# Patient Record
Sex: Male | Born: 1995 | Race: Black or African American | Hispanic: No | Marital: Single | Smoking: Never smoker
Health system: Southern US, Community
[De-identification: ages and names within clinical notes are randomized; demographics above are authoritative.]

## PROBLEM LIST (undated history)

## (undated) DIAGNOSIS — J45909 Unspecified asthma, uncomplicated: Secondary | ICD-10-CM

---

## 2019-02-11 ENCOUNTER — Other Ambulatory Visit: Payer: Self-pay

## 2019-02-11 ENCOUNTER — Emergency Department
Admission: EM | Admit: 2019-02-11 | Discharge: 2019-02-11 | Disposition: A | Discharge status: Home / Self Care | Payer: Medicaid - Out of State | Attending: Emergency Medicine | Admitting: Emergency Medicine

## 2019-02-11 ENCOUNTER — Encounter: Payer: Self-pay | Admitting: Emergency Medicine

## 2019-02-11 DIAGNOSIS — J45909 Unspecified asthma, uncomplicated: Secondary | ICD-10-CM | POA: Insufficient documentation

## 2019-02-11 DIAGNOSIS — Z202 Contact with and (suspected) exposure to infections with a predominantly sexual mode of transmission: Secondary | ICD-10-CM | POA: Insufficient documentation

## 2019-02-11 HISTORY — DX: Unspecified asthma, uncomplicated: J45.909

## 2019-02-11 MED ORDER — AZITHROMYCIN 500 MG PO TABS
1000.0000 mg | ORAL_TABLET | Freq: Once | ORAL | Status: AC
  Administered 2019-02-11: 14:00:00 1000 mg via ORAL
  Filled 2019-02-11: qty 2

## 2019-02-11 MED ORDER — CEFTRIAXONE SODIUM 250 MG IJ SOLR
250.0000 mg | Freq: Once | INTRAMUSCULAR | Status: AC
  Administered 2019-02-11: 14:00:00 250 mg via INTRAMUSCULAR
  Filled 2019-02-11: qty 250

## 2019-02-11 NOTE — ED Triage Notes (Signed)
Pt states his SO tested positive for chlamydia, denies symptoms, states wants a prescription for tx. States recent unprotected sex with SO.

## 2019-02-11 NOTE — Discharge Instructions (Addendum)
You have been treated for gonorrhea and chlamydia. You should avoid any intimate/sexual contact for 1 week. Your partner(s) should be tested, treated, and symptom-free. Follow-up with the [facility] Department for ongoing symptoms.

## 2019-02-11 NOTE — ED Provider Notes (Signed)
[facility] Emergency Department Provider Note ____________________________________________  Time seen: 1319  I have reviewed the triage vital signs and the nursing notes.  HISTORY  Chief Complaint  SEXUALLY TRANSMITTED DISEASE  HPI Carl Munoz is a 23 y.o. male presents himself to the ED for evaluation of STD exposure.  Patient describes he was notified by his partner, that they tested positive for chlamydia.  He presents now for evaluation and treatment.  He denies any complaints or symptoms at this time.  Past Medical History:  Diagnosis Date  . Asthma     There are no active problems to display for this patient.   History reviewed. No pertinent surgical history.  Prior to Admission medications   Not on File    Allergies Patient has no known allergies.  No family history on file.  Social History Social History   Tobacco Use  . Smoking status: Never Smoker  . Smokeless tobacco: Never Used  Substance Use Topics  . Alcohol use: Not Currently  . Drug use: Not Currently    Review of Systems  Constitutional: Negative for fever. Eyes: Negative for visual changes. ENT: Negative for sore throat. Cardiovascular: Negative for chest pain. Respiratory: Negative for shortness of breath. Gastrointestinal: Negative for abdominal pain, vomiting and diarrhea. Genitourinary: Negative for dysuria. Musculoskeletal: Negative for back pain. Skin: Negative for rash. Neurological: Negative for headaches, focal weakness or numbness. ____________________________________________  PHYSICAL EXAM:  VITAL SIGNS: ED Triage Vitals [02/11/19 1304]  Enc Vitals Group     BP 140/78     Pulse Rate (!) 54     Resp 18     Temp 98.9 F (37.2 C)     Temp Source Oral     SpO2 99 %     Weight 180 lb (81.6 kg)     Height 6' (1.829 m)     Head Circumference      Peak Flow      Pain Score 0     Pain Loc      Pain Edu?      Excl. in [place]?     Constitutional:  Alert and oriented. Well appearing and in no distress. Head: Normocephalic and atraumatic. Eyes: Conjunctivae are normal. Normal extraocular movements Cardiovascular: Normal rate, regular rhythm. Normal distal pulses. Respiratory: Normal respiratory effort GU: Deferred Musculoskeletal: Nontender with normal range of motion in all extremities.  Neurologic:  Normal gait without ataxia. Normal speech and language. No gross focal neurologic deficits are appreciated. Skin:  Skin is warm, dry and intact. No rash noted. ____________________________________________   LABS (pertinent positives/negatives) Labs Reviewed  GC/CHLAMYDIA PROBE AMP  ____________________________________________  PROCEDURES  Procedures Rocephin 250 mg IM Azithromycin 1 g PO ____________________________________________  INITIAL IMPRESSION / ASSESSMENT AND PLAN / ED COURSE  Carl Munoz was evaluated in Emergency Department on 02/11/2019 for the symptoms described in the history of present illness. He was evaluated in the context of the global COVID-19 pandemic, which necessitated consideration that the patient might be at risk for infection with the SARS-CoV-2 virus that causes COVID-19. Institutional protocols and algorithms that pertain to the evaluation of patients at risk for COVID-19 are in a state of rapid change based on information released by regulatory bodies including the CDC and federal and state organizations. These policies and algorithms were followed during the patient's care in the ED.  Patient with ED evaluation over STD exposure.  He presents for evaluation and management.  He denies any GU symptoms at this time.  Patient is treated empirically for gonorrhea/chlamydia with azithromycin and Rocephin.  He is advised to avoid any sexual contact for least a week.  He is also notified that his partner should be tested, treated, and fully symptom-free before any intimate contact.  He will follow-up with the  [facility] health department as needed.  Return precautions have been reviewed. ____________________________________________  FINAL CLINICAL IMPRESSION(S) / ED DIAGNOSES  Final diagnoses:  STD exposure      Daesia Zylka, Charlesetta IvoryJenise V Bacon, PA-C 02/11/19 1357    Minna AntisPaduchowski, Kevin, MD 02/11/19 1422

## 2019-02-11 NOTE — ED Notes (Signed)
See triage note  States he is here for STD treatment    States his sexual partner was positive for chlamydia

## 2019-02-14 ENCOUNTER — Telehealth: Payer: Self-pay | Admitting: Emergency Medicine

## 2019-02-14 LAB — GC/CHLAMYDIA PROBE AMP
Chlamydia trachomatis, NAA: POSITIVE — AB
Neisseria Gonorrhoeae by PCR: NEGATIVE

## 2019-02-14 NOTE — Telephone Encounter (Signed)
Called patient to inform of positive chlamydia.  He was treated during ED visit.  Left message asking him to call me

## 2019-02-14 NOTE — Telephone Encounter (Signed)
Patient called me back and I gave him result of chlamydia test.

## 2019-05-17 ENCOUNTER — Other Ambulatory Visit: Payer: Self-pay

## 2019-05-17 ENCOUNTER — Emergency Department
Admission: EM | Admit: 2019-05-17 | Discharge: 2019-05-17 | Disposition: A | Discharge status: Home / Self Care | Payer: Medicaid - Out of State | Attending: Emergency Medicine | Admitting: Emergency Medicine

## 2019-05-17 ENCOUNTER — Encounter: Payer: Self-pay | Admitting: Emergency Medicine

## 2019-05-17 DIAGNOSIS — M545 Low back pain, unspecified: Secondary | ICD-10-CM

## 2019-05-17 DIAGNOSIS — X500XXA Overexertion from strenuous movement or load, initial encounter: Secondary | ICD-10-CM | POA: Diagnosis not present

## 2019-05-17 DIAGNOSIS — Y929 Unspecified place or not applicable: Secondary | ICD-10-CM | POA: Insufficient documentation

## 2019-05-17 DIAGNOSIS — Y93B3 Activity, free weights: Secondary | ICD-10-CM | POA: Diagnosis not present

## 2019-05-17 DIAGNOSIS — Y999 Unspecified external cause status: Secondary | ICD-10-CM | POA: Insufficient documentation

## 2019-05-17 DIAGNOSIS — J45909 Unspecified asthma, uncomplicated: Secondary | ICD-10-CM | POA: Insufficient documentation

## 2019-05-17 MED ORDER — CYCLOBENZAPRINE HCL 5 MG PO TABS: ORAL_TABLET | ORAL | 0 refills | Status: DC

## 2019-05-17 MED ORDER — IBUPROFEN 600 MG PO TABS: 600.0000 mg | ORAL_TABLET | Freq: Four times a day (QID) | ORAL | 0 refills | Status: DC | PRN

## 2019-05-17 MED ORDER — LIDOCAINE 5 % EX PTCH: 1.0000 | MEDICATED_PATCH | CUTANEOUS | 0 refills | Status: DC

## 2019-05-17 NOTE — ED Notes (Signed)
Pt c/o back pain x1 week now and thinks it may be d/t lifting weights.

## 2019-05-17 NOTE — Discharge Instructions (Signed)
Please return to the emergency department for any worsening of symptoms.  Please call either the back specialist or your primary care provider on Monday for an appointment next week.  Do not drive or work while taking the muscle relaxer.

## 2019-05-17 NOTE — ED Triage Notes (Signed)
Pt here with c/o lower back pain d/t weight lifting, walked to triage with no issues. NAD.

## 2019-05-17 NOTE — ED Provider Notes (Signed)
[facility] Emergency Department Provider Note  ____________________________________________  Time seen: Approximately 6:22 PM  I have reviewed the triage vital signs and the nursing notes.   HISTORY  Chief Complaint Back Pain    HPI Carl Munoz is a 23 y.o. male that presents to the emergency department for evaluation of low back pain after weightlifting today.  Patient was doing squats with 120 pound weights when he developed pain to his upper low back. He did not feel any specific pop. Pain is worse with movement. He had his trainer evaluated him and was told it may be a muscle.  Patient came to the emergency department to be sure.  He does not feel that anything is broken.  Pain does not radiate.  No bowel or bladder dysfunction or saddle anesthesias.  He has not taken anything for pain.  No numbness, tingling, weakness.  Past Medical History:  Diagnosis Date  . Asthma     There are no active problems to display for this patient.   History reviewed. No pertinent surgical history.  Prior to Admission medications   Medication Sig Start Date End Date Taking? Authorizing Provider  cyclobenzaprine (FLEXERIL) 5 MG tablet Take 1-2 tablets 3 times daily as needed 05/17/19   Laban Emperor, PA-C  ibuprofen (ADVIL) 600 MG tablet Take 1 tablet (600 mg total) by mouth every 6 (six) hours as needed. 05/17/19   Laban Emperor, PA-C  lidocaine (LIDODERM) 5 % Place 1 patch onto the skin daily. Remove & Discard patch within 12 hours or as directed by MD 05/17/19   Laban Emperor, PA-C    Allergies Patient has no known allergies.  No family history on file.  Social History Social History   Tobacco Use  . Smoking status: Never Smoker  . Smokeless tobacco: Never Used  Substance Use Topics  . Alcohol use: Not Currently  . Drug use: Not Currently     Review of Systems  Cardiovascular: No chest pain. Respiratory: No SOB. Gastrointestinal: No abdominal pain.   No nausea, no vomiting.  Musculoskeletal: Positive for back pain. Skin: Negative for rash, abrasions, lacerations, ecchymosis. Neurological: Negative for headaches, numbness or tingling   ____________________________________________   PHYSICAL EXAM:  VITAL SIGNS: ED Triage Vitals  Enc Vitals Group     BP 05/17/19 1654 104/83     Pulse Rate 05/17/19 1654 (!) 59     Resp 05/17/19 1654 18     Temp 05/17/19 1654 98.3 F (36.8 C)     Temp Source 05/17/19 1654 Oral     SpO2 05/17/19 1654 98 %     Weight 05/17/19 1649 185 lb (83.9 kg)     Height 05/17/19 1649 6' (1.829 m)     Head Circumference --      Peak Flow --      Pain Score 05/17/19 1649 7     Pain Loc --      Pain Edu? --      Excl. in [place]? --      Constitutional: Alert and oriented. Well appearing and in no acute distress. Eyes: Conjunctivae are normal. PERRL. EOMI. Head: Atraumatic. ENT:      Ears:      Nose: No congestion/rhinnorhea.      Mouth/Throat: Mucous membranes are moist.  Neck: No stridor. Cardiovascular: Normal rate, regular rhythm.  Good peripheral circulation. Respiratory: Normal respiratory effort without tachypnea or retractions. Lungs CTAB. Good air entry to the bases with no decreased or absent breath sounds.  Gastrointestinal: Bowel sounds 4 quadrants. Soft and nontender to palpation. No guarding or rigidity. No palpable masses. No distention.  Musculoskeletal: Full range of motion to all extremities. No gross deformities appreciated.  Mild tenderness to palpation superior lumbar spine.  No pinpoint tenderness.  Strength equal in lower extremities bilaterally.  Normal gait.   Neurologic:  Normal speech and language. No gross focal neurologic deficits are appreciated.  Skin:  Skin is warm, dry and intact. No rash noted. Psychiatric: Mood and affect are normal. Speech and behavior are normal. Patient exhibits appropriate insight and judgement.   ____________________________________________    LABS (all labs ordered are listed, but only abnormal results are displayed)  Labs Reviewed - No data to display ____________________________________________  EKG   ____________________________________________  RADIOLOGY   No results found.  ____________________________________________    PROCEDURES  Procedure(s) performed:    Procedures    Medications - No data to display   ____________________________________________   INITIAL IMPRESSION / ASSESSMENT AND PLAN / ED COURSE  Pertinent labs & imaging results that were available during my care of the patient were reviewed by me and considered in my medical decision making (see chart for details).  Review of the [place] CSRS was performed in accordance of the [facility] prior to dispensing any controlled drugs.   Patient presented to the emergency department for evaluation of back pain today.  Vital signs and exam are reassuring.  X-ray was offered and patient declines at this time.  He would prefer trying muscle relaxers and anti-inflammatories and will follow up with primary care or orthopedics next week.  Patient will be discharged home with prescriptions for Flexeril and Motrin. Patient is to follow up with primary care and orthopedics as directed. Patient is given ED precautions to return to the ED for any worsening or new symptoms.  Carl Munoz was evaluated in Emergency Department on 05/17/2019 for the symptoms described in the history of present illness. He was evaluated in the context of the global COVID-19 pandemic, which necessitated consideration that the patient might be at risk for infection with the SARS-CoV-2 virus that causes COVID-19. Institutional protocols and algorithms that pertain to the evaluation of patients at risk for COVID-19 are in a state of rapid change based on information released by regulatory bodies including the CDC and federal and state organizations. These policies and algorithms were followed  during the patient's care in the ED.   ____________________________________________  FINAL CLINICAL IMPRESSION(S) / ED DIAGNOSES  Final diagnoses:  Acute midline low back pain without sciatica      NEW MEDICATIONS STARTED DURING THIS VISIT:  ED Discharge Orders         Ordered    ibuprofen (ADVIL) 600 MG tablet  Every 6 hours PRN     05/17/19 1834    cyclobenzaprine (FLEXERIL) 5 MG tablet     05/17/19 1834    lidocaine (LIDODERM) 5 %  Every 24 hours     05/17/19 1835              This chart was dictated using voice recognition software/Dragon. Despite best efforts to proofread, errors can occur which can change the meaning. Any change was purely unintentional.    Enid Derry, PA-C 05/17/19 1913    Minna Antis, MD 05/17/19 2326

## 2020-07-31 ENCOUNTER — Ambulatory Visit
Admission: RE | Admit: 2020-07-31 | Discharge: 2020-07-31 | Disposition: A | Discharge status: Home / Self Care | Payer: No Typology Code available for payment source | Source: Ambulatory Visit | Attending: Family Medicine | Admitting: Family Medicine

## 2020-07-31 ENCOUNTER — Other Ambulatory Visit: Payer: Self-pay | Admitting: Family Medicine

## 2020-07-31 ENCOUNTER — Other Ambulatory Visit: Payer: Self-pay

## 2020-07-31 DIAGNOSIS — R0602 Shortness of breath: Secondary | ICD-10-CM

## 2020-07-31 NOTE — Progress Notes (Signed)
Patient seen and evaluated by myself at [facility]. Originally, SOB and chest tightness after running in the cold with history of asthma. Prescribed Albuterol inhaler to use before exercise but it has not helped.  Has history of COVID infection in January 06/20/2020  Will start cardiac workup and order chest x-ray.

## 2020-08-12 NOTE — Progress Notes (Unsigned)
Cardiology Office Note:   Date:  08/13/2020  NAME:  Carl Munoz    MRN: [id number] DOB:  1996/06/07   PCP:  Patient, No Pcp Per  Cardiologist:  No primary care provider on file.  Electrophysiologist:  None   Referring MD: Arlyce Harman, DO   Chief Complaint  Patient presents with  . New Patient (Initial Visit)  . Chest Pain    History of Present Illness:   Carl Munoz is a 25 y.o. male with a hx of asthma who is being seen today for the evaluation of chest pain at the request of Arlyce Harman, DO. CXR normal. He reports he had COVID-19 infection on June 20, 2020. Symptoms lasted roughly 18 days. Symptoms included headache, fatigue, shortness of breath had cold symptoms. He reports he had chest congestion as well. Since that time he tried to go back to high-level activity. He reports shortness of breath with activity as well as tightness in his chest. Symptoms of chest tightness occur only with exertion. He tried an albuterol inhaler without improvement in symptoms. 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 Only real medical problem is asthma. He has been on light protocol and then taken out as symptoms did not improve. No improvement with inhaler. X-ray was normal. He is never had any heart disease. His EKG demonstrates sinus bradycardia heart rate 47. He has profound early repolarization changes which are normal and athlete. No family history of sudden death. He is not passed out. He reports symptoms only occur with heavy exertion. Not occurring with light exercise. He does not smoke, drink or use drugs. He is setting psychology. He has 2 more years of [employer] eligibility.  Past Medical History: Past Medical History:  Diagnosis Date  . Asthma     Past Surgical History: History reviewed. No pertinent surgical history.  Current Medications: Current Meds  Medication Sig  . cyclobenzaprine (FLEXERIL) 5 MG tablet Take 1-2 tablets 3 times daily as needed  . ibuprofen  (ADVIL) 600 MG tablet Take 1 tablet (600 mg total) by mouth every 6 (six) hours as needed.  . lidocaine (LIDODERM) 5 % Place 1 patch onto the skin daily. Remove & Discard patch within 12 hours or as directed by MD     Allergies:    Patient has no known allergies.   Social History: Social History   Socioeconomic History  . Marital status: Single    Spouse name: Not on file  . Number of children: Not on file  . Years of education: Not on file  . Highest education level: Not on file  Occupational History  . Occupation: [employer]   Tobacco Use  . Smoking status: Never Smoker  . Smokeless tobacco: Never Used  Substance and Sexual Activity  . Alcohol use: Not Currently  . Drug use: Not Currently  . Sexual activity: Yes  Other Topics Concern  . Not on file  Social History Narrative  . Not on file   Social Determinants of Health   Financial Resource Strain: Not on file  Food Insecurity: Not on file  Transportation Needs: Not on file  Physical Activity: Not on file  Stress: Not on file  Social Connections: Not on file     Family History: The patient's family history is not on file.  ROS:   All other ROS reviewed and negative. Pertinent positives noted in the HPI.     EKGs/Labs/Other Studies Reviewed:   The following studies were personally reviewed  by me today:  EKG:  EKG is ordered today.  The ekg ordered today demonstrates normal sinus rhythm heart rate 47, early repolarization abnormality, normal EKG for young athletic male, and was personally reviewed by me.   Recent Labs: No results found for requested labs within last 8760 hours.   Recent Lipid Panel No results found for: CHOL, TRIG, HDL, CHOLHDL, VLDL, LDLCALC, LDLDIRECT  Physical Exam:   VS:  BP (!) 122/94 (BP Location: Left Arm, Patient Position: Sitting, Cuff Size: Normal)   Pulse (!) 47   Ht 6' (1.829 m)   Wt 188 lb (85.3 kg)   BMI 25.50 kg/m    Wt Readings from Last 3 Encounters:  08/13/20  188 lb (85.3 kg)  05/17/19 185 lb (83.9 kg)  02/11/19 180 lb (81.6 kg)    General: Well nourished, well developed, in no acute distress Head: Atraumatic, normal size  Eyes: PEERLA, EOMI  Neck: Supple, no JVD Endocrine: No thryomegaly Cardiac: Normal S1, S2; RRR; no murmurs, rubs, or gallops Lungs: Clear to auscultation bilaterally, no wheezing, rhonchi or rales  Abd: Soft, nontender, no hepatomegaly  Ext: No edema, pulses 2+ Musculoskeletal: No deformities, BUE and BLE strength normal and equal Skin: Warm and dry, no rashes   Neuro: Alert and oriented to person, place, time, and situation, CNII-XII grossly intact, no focal deficits  Psych: Normal mood and affect   ASSESSMENT:   Carl Munoz is a 25 y.o. male who presents for the following: 1. Chest pain, unspecified type   2. SOB (shortness of breath) on exertion     PLAN:   1. Chest pain, unspecified type 2. SOB (shortness of breath) on exertion -Exertional chest tightness and shortness of breath with exertion. EKG shows early repolarization normality. He has no alarming symptoms concerning for Covid myocarditis. I think he likely just has asthma type symptoms given the tightness in his chest that occurs with exertion. Symptoms appear to be worse by heavy breathing. We will exclude myocarditis with a high-sensitivity troponin. He was informed that on the day of his blood tests he will need to refrain from any activity for 24 hours. He should just go to class and that is it. We also will set him up for an echocardiogram to exclude any subclinical myocardial injury. He has no evidence of heart failure examination. If his above work-up is negative, I have little to offer. He is unlikely to have any obstructive CAD. He is too young for this. He is a very high functioning athlete. We will communicate our findings to [facility] [phone]. She is the Event organiser. We also will reach out to the team physician Arlyce Harman.   Disposition: No follow-ups on file.  Medication Adjustments/Labs and Tests Ordered: Current medicines are reviewed at length with the patient today.  Concerns regarding medicines are outlined above.  Orders Placed This Encounter  Procedures  . EKG 12-Lead  . ECHOCARDIOGRAM COMPLETE   No orders of the defined types were placed in this encounter.   Patient Instructions  Medication Instructions:  The current medical regimen is effective;  continue present plan and medications.  *If you need a refill on your cardiac medications before your next appointment, please call your pharmacy*   Lab Work: HS TROPONIN ([address], with ECHO)  If you have labs (blood work) drawn today and your tests are completely normal, you will receive your results only by: Marland Kitchen MyChart Message (if you have MyChart) OR . A paper copy in  the mail If you have any lab test that is abnormal or we need to change your treatment, we will call you to review the results.   Testing/Procedures: Echocardiogram - Your physician has requested that you have an echocardiogram. Echocardiography is a painless test that uses sound waves to create images of your heart. It provides your doctor with information about the size and shape of your heart and how well your heart's chambers and valves are working. This procedure takes approximately one hour. There are no restrictions for this procedure. This will be performed at our [facility] location - [address], [address].    Follow-Up: At [facility], you and your health needs are our priority.  As part of our continuing mission to provide you with exceptional heart care, we have created designated Provider Care Teams.  These Care Teams include your primary Cardiologist (physician) and Advanced Practice Providers (APPs -  Physician Assistants and Nurse Practitioners) who all work together to provide you with the care you need, when you need it.  We recommend  signing up for the patient portal called "MyChart".  Sign up information is provided on this After Visit Summary.  MyChart is used to connect with patients for Virtual Visits (Telemedicine).  Patients are able to view lab/test results, encounter notes, upcoming appointments, etc.  Non-urgent messages can be sent to your provider as well.   To learn more about what you can do with MyChart, go to [email or web address].    Your next appointment:   As needed  The format for your next appointment:   In Person  Provider:   Lennie Odor, MD       Signed, Lenna Gilford. Flora Lipps, MD [facility]  [address], [address] [place], [place] [address] [phone]  08/13/2020 3:04 PM

## 2020-08-13 ENCOUNTER — Ambulatory Visit (INDEPENDENT_AMBULATORY_CARE_PROVIDER_SITE_OTHER): Payer: PRIVATE HEALTH INSURANCE | Admitting: Cardiovascular Disease

## 2020-08-13 ENCOUNTER — Encounter: Payer: Self-pay | Admitting: Cardiovascular Disease

## 2020-08-13 ENCOUNTER — Other Ambulatory Visit: Payer: Self-pay

## 2020-08-13 VITALS — BP 122/94 | HR 47 | Ht 72.0 in | Wt 188.0 lb

## 2020-08-13 DIAGNOSIS — R079 Chest pain, unspecified: Secondary | ICD-10-CM

## 2020-08-13 DIAGNOSIS — R0602 Shortness of breath: Secondary | ICD-10-CM | POA: Diagnosis not present

## 2020-08-13 NOTE — Patient Instructions (Signed)
Medication Instructions:  The current medical regimen is effective;  continue present plan and medications.  *If you need a refill on your cardiac medications before your next appointment, please call your pharmacy*   Lab Work: HS TROPONIN ([address], with ECHO)  If you have labs (blood work) drawn today and your tests are completely normal, you will receive your results only by: Marland Kitchen MyChart Message (if you have MyChart) OR . A paper copy in the mail If you have any lab test that is abnormal or we need to change your treatment, we will call you to review the results.   Testing/Procedures: Echocardiogram - Your physician has requested that you have an echocardiogram. Echocardiography is a painless test that uses sound waves to create images of your heart. It provides your doctor with information about the size and shape of your heart and how well your heart's chambers and valves are working. This procedure takes approximately one hour. There are no restrictions for this procedure. This will be performed at our [facility] location - [address], [address].    Follow-Up: At [facility], you and your health needs are our priority.  As part of our continuing mission to provide you with exceptional heart care, we have created designated Provider Care Teams.  These Care Teams include your primary Cardiologist (physician) and Advanced Practice Providers (APPs -  Physician Assistants and Nurse Practitioners) who all work together to provide you with the care you need, when you need it.  We recommend signing up for the patient portal called "MyChart".  Sign up information is provided on this After Visit Summary.  MyChart is used to connect with patients for Virtual Visits (Telemedicine).  Patients are able to view lab/test results, encounter notes, upcoming appointments, etc.  Non-urgent messages can be sent to your provider as well.   To learn more about what you can do with MyChart, go to  [email or web address].    Your next appointment:   As needed  The format for your next appointment:   In Person  Provider:   Lennie Odor, MD

## 2020-08-18 ENCOUNTER — Other Ambulatory Visit (HOSPITAL_COMMUNITY): Payer: No Typology Code available for payment source

## 2020-08-18 ENCOUNTER — Other Ambulatory Visit: Payer: Medicaid - Out of State

## 2020-08-18 ENCOUNTER — Other Ambulatory Visit: Payer: Self-pay

## 2020-08-18 ENCOUNTER — Encounter (HOSPITAL_COMMUNITY): Payer: Self-pay

## 2020-08-18 DIAGNOSIS — R079 Chest pain, unspecified: Secondary | ICD-10-CM

## 2020-08-18 LAB — TROPONIN I (HIGH SENSITIVITY): Troponin I (High Sensitivity): 13 ng/L (ref <18)

## 2020-08-18 NOTE — Progress Notes (Signed)
Verified appointment "no show" status with A. Martinez at 14:00.

## 2020-08-19 ENCOUNTER — Other Ambulatory Visit: Payer: No Typology Code available for payment source

## 2020-09-03 ENCOUNTER — Ambulatory Visit (HOSPITAL_COMMUNITY): Payer: Medicaid - Out of State | Attending: Cardiology

## 2020-09-03 ENCOUNTER — Other Ambulatory Visit: Payer: Self-pay

## 2020-09-03 DIAGNOSIS — R079 Chest pain, unspecified: Secondary | ICD-10-CM | POA: Diagnosis present

## 2020-09-03 LAB — ECHOCARDIOGRAM COMPLETE
Area-P 1/2: 2.77 cm2
S' Lateral: 3.4 cm

## 2020-11-13 ENCOUNTER — Other Ambulatory Visit: Payer: Self-pay

## 2020-11-13 ENCOUNTER — Emergency Department
Admission: EM | Admit: 2020-11-13 | Discharge: 2020-11-13 | Disposition: A | Discharge status: Home / Self Care | Payer: Medicaid - Out of State | Attending: Emergency Medicine | Admitting: Emergency Medicine

## 2020-11-13 DIAGNOSIS — J45909 Unspecified asthma, uncomplicated: Secondary | ICD-10-CM | POA: Insufficient documentation

## 2020-11-13 DIAGNOSIS — K0889 Other specified disorders of teeth and supporting structures: Secondary | ICD-10-CM | POA: Insufficient documentation

## 2020-11-13 MED ORDER — AMOXICILLIN 875 MG PO TABS: 875.0000 mg | ORAL_TABLET | Freq: Two times a day (BID) | ORAL | 0 refills | Status: AC

## 2020-11-13 NOTE — ED Triage Notes (Signed)
Pt c/o left lower dental pain , states his tooth cracked

## 2020-11-13 NOTE — ED Notes (Signed)
Pt c/o left lower dental pain x2 days. Pt reports he cracked his molar awhile back but has not been able to see dentist due to insurance issues. Pt is AOX4, NAD noted. No swelling noted in left jaw.

## 2020-11-13 NOTE — ED Provider Notes (Signed)
[facility] Emergency Department Provider Note   ____________________________________________   Event Date/Time   First MD Initiated Contact with Patient 11/13/20 1103     (approximate)  I have reviewed the triage vital signs and the nursing notes.   HISTORY  Chief Complaint No chief complaint on file.   HPI Carl Munoz is a 25 y.o. male presents to the ED with complaint of left lower tooth pain.  Patient states that he cracked a tooth over 6 months ago but only began having pain 2 days ago.  He denies any fever or chills.  He currently does not have a Education officer, community as he is on [place] and no one in [place] will take him.  He rates pain as 7 out of 10.       Past Medical History:  Diagnosis Date  . Asthma     There are no problems to display for this patient.   History reviewed. No pertinent surgical history.  Prior to Admission medications   Medication Sig Start Date End Date Taking? Authorizing Provider  amoxicillin (AMOXIL) 875 MG tablet Take 1 tablet (875 mg total) by mouth 2 (two) times daily. 11/13/20  Yes Tommi Rumps, PA-C    Allergies Patient has no known allergies.  No family history on file.  Social History Social History   Tobacco Use  . Smoking status: Never Smoker  . Smokeless tobacco: Never Used  Substance Use Topics  . Alcohol use: Not Currently  . Drug use: Not Currently    Review of Systems Constitutional: No fever/chills Eyes: No visual changes. ENT: Positive for dental pain. Cardiovascular: Denies chest pain. Respiratory: Denies shortness of breath. Gastrointestinal:   No nausea, no vomiting.  Musculoskeletal: Negative for muscle skeletal pain. Skin: Negative for rash. Neurological: Negative for  focal weakness or numbness. ____________________________________________   PHYSICAL EXAM:  VITAL SIGNS: ED Triage Vitals  Enc Vitals Group     BP 11/13/20 1026 (!) 145/70     Pulse Rate  11/13/20 1026 (!) 45     Resp 11/13/20 1026 16     Temp 11/13/20 1026 98 F (36.7 C)     Temp Source 11/13/20 1026 Oral     SpO2 11/13/20 1026 98 %     Weight 11/13/20 1019 190 lb (86.2 kg)     Height 11/13/20 1019 6' (1.829 m)     Head Circumference --      Peak Flow --      Pain Score 11/13/20 1019 7     Pain Loc --      Pain Edu? --      Excl. in GC? --     Constitutional: Alert and oriented. Well appearing and in no acute distress. Eyes: Conjunctivae are normal. PERRL. EOMI. Head: Atraumatic. Nose: No congestion/rhinnorhea. Mouth/Throat: Mucous membranes are moist.  Oropharynx non-erythematous.  Medial aspect of the lower left premolar there is a tooth where is enamel that is avulsed.  Area has tenderness but no obvious abscess formation is noted. Neck: No stridor.   Cardiovascular: Normal rate, regular rhythm. Grossly normal heart sounds.  Good peripheral circulation. Respiratory: Normal respiratory effort.  No retractions. Lungs CTAB. Musculoskeletal: Moves upper and lower extremities any difficulty.  Normal gait was noted. Neurologic:  Normal speech and language. No gross focal neurologic deficits are appreciated. No gait instability. Skin:  Skin is warm, dry and intact. No rash noted. Psychiatric: Mood and affect are normal. Speech and behavior are normal.  ____________________________________________   LABS (all labs ordered are listed, but only abnormal results are displayed)  Labs Reviewed - No data to display  PROCEDURES  Procedure(s) performed (including Critical Care):  Procedures   ____________________________________________   INITIAL IMPRESSION / ASSESSMENT AND PLAN / ED COURSE  As part of my medical decision making, I reviewed the following data within the electronic MEDICAL RECORD NUMBER Notes from prior ED visits and [place] Controlled Substance Database  25 year old male presents to the ED with complaint of dental pain for the last 2 days.  Patient states  that he cracked his tooth over 6 months ago but has not had any problems until just recently.  Patient currently is on Medicaid out[place]of-[place] and is having problems finding a dentist that will see him.  1 option other than going to [place] to see a dentist is for him to change his Medicaid to [place] as he lives here.  He was given list of dental clinics to call to see if they accept out-of-state Medicaid.  A prescription for amoxicillin 875 twice daily was sent to his pharmacy.  ____________________________________________   FINAL CLINICAL IMPRESSION(S) / ED DIAGNOSES  Final diagnoses:  Pain, dental     ED Discharge Orders         Ordered    amoxicillin (AMOXIL) 875 MG tablet  2 times daily        11/13/20 1113          *Please note:  Carl Munoz was evaluated in Emergency Department on 11/13/2020 for the symptoms described in the history of present illness. He was evaluated in the context of the global COVID-19 pandemic, which necessitated consideration that the patient might be at risk for infection with the SARS-CoV-2 virus that causes COVID-19. Institutional protocols and algorithms that pertain to the evaluation of patients at risk for COVID-19 are in a state of rapid change based on information released by regulatory bodies including the CDC and federal and state organizations. These policies and algorithms were followed during the patient's care in the ED.  Some ED evaluations and interventions may be delayed as a result of limited staffing during and the pandemic.*   Note:  This document was prepared using Dragon voice recognition software and may include unintentional dictation errors.    Tommi Rumps, PA-C 11/13/20 1119    Minna Antis, MD 11/13/20 1943

## 2020-11-13 NOTE — Discharge Instructions (Signed)
Call the clinics listed on your discharge papers as one of them may take your out-of-state Medicaid.  Begin taking antibiotics until completely finished.  You may take Tylenol or ibuprofen with this medication if needed for pain.  You will definitely need to see a dentist to have this repaired as it will continue to give problems.  OPTIONS FOR DENTAL FOLLOW UP CARE  [place] Department of Health and Human Services - Local Safety Net Dental Clinics [email or web address]   [facility] [phone])  Carl Munoz [phone])  [place] [phone] ext [phone])  [facility] [phone])  [facility] [phone]) This clinic caters to the indigent population and is on a lottery system. Location: [employer] [facility], [employer], [address], [place] Clinic Hours: Wednesdays from 6pm - 9pm, patients seen by a lottery system. For dates, call or go to [email or web address] Services: Cleanings, fillings and simple extractions. Payment Options: DENTAL WORK IS FREE OF CHARGE. Bring proof of income or support. Best way to get seen: Arrive at 5:15 pm - this is a lottery, NOT first come/first serve, so arriving earlier will not increase your chances of being seen.     [facility] [phone] Select option 1 for emergencies   Location: [facility], [place], [address] [place] Clinic Hours: No walk-ins accepted - call the day before to schedule an appointment. Check in times are 9:30 am and 1:30 pm. Services: Simple extractions, temporary fillings, pulpectomy/pulp debridement, uncomplicated abscess drainage. Payment Options: PAYMENT IS DUE AT THE TIME OF SERVICE.  Fee is usually $100-200, additional surgical procedures (e.g. abscess drainage) may be extra. Cash, checks, Visa/MasterCard accepted.   Can file Medicaid if patient is covered for dental - patient should call case worker to check. No discount for [facility] patients. Best way to get seen: MUST call the day before and get onto the schedule. Can usually be seen the next 1-2 days. No walk-ins accepted.     [facility] [phone]   Location: [facility], [address] [place] Clinic Hours: M, W, Th, F 8am or 1:30pm, Tues 9a or 1:30 - first come/first served. Services: Simple extractions, temporary fillings, uncomplicated abscess drainage.  You do not need to be an [facility] resident. Payment Options: PAYMENT IS DUE AT THE TIME OF SERVICE. Dental insurance, otherwise sliding scale - bring proof of income or support. Depending on income and treatment needed, cost is usually $50-200. Best way to get seen: Arrive early as it is first come/first served.     [facility] [phone]   Location: [address] Clinic Hours: Mon-Thu 8a-5p Services: Most basic dental services including extractions and fillings. Payment Options: PAYMENT IS DUE AT THE TIME OF SERVICE. Sliding scale, up to 50% off - bring proof if income or support. Medicaid with dental option accepted. Best way to get seen: Call to schedule an appointment, can usually be seen within 2 weeks OR they will try to see walk-ins - show up at 8a or 2p (you may have to wait).     [facility] [phone] [place] RESIDENTS ONLY   Location: [facility], [address], [place], [place] [address] Clinic Hours: By appointment only. Monday - Thursday 8am-5pm, Friday 8am-12pm Services: Cleanings, fillings, extractions. Payment Options: PAYMENT IS DUE AT THE TIME OF SERVICE. Cash, Visa or MasterCard. Sliding scale - $30 minimum  per service. Best way to get seen: Come in to office, complete packet and make an appointment - need  proof of income or support monies for each household member and proof of [facility] residence. Usually takes about a month to get in.     [facility] [phone]   Location: [address], [facility] Clinic Hours: Walk-in Urgent Care Dental Services are offered Monday-Friday mornings only. The numbers of emergencies accepted daily is limited to the number of providers available. Maximum 15 - Mondays, Wednesdays & Thursdays Maximum 10 - Tuesdays & Fridays Services: You do not need to be a [facility] resident to be seen for a dental emergency. Emergencies are defined as pain, swelling, abnormal bleeding, or dental trauma. Walkins will receive x-rays if needed. NOTE: Dental cleaning is not an emergency. Payment Options: PAYMENT IS DUE AT THE TIME OF SERVICE. Minimum co-pay is $40.00 for uninsured patients. Minimum co-pay is $3.00 for Medicaid with dental coverage. Dental Insurance is accepted and must be presented at time of visit. Medicare does not cover dental. Forms of payment: Cash, credit card, checks. Best way to get seen: If not previously registered with the clinic, walk-in dental registration begins at 7:15 am and is on a first come/first serve basis. If previously registered with the clinic, call to make an appointment.     [facility] [phone] [place] RESIDENTS ONLY   Location: [address], [place], [place] Clinic Hours: Mon-Thu 10a-2p Services: Extractions only! Payment Options: FREE (donations accepted) - bring proof of income or support Best way to get seen: Call and schedule an appointment OR come at 8am on the 1st Monday of every month (except for holidays) when it is first come/first served.     [facility] [phone]   Location: [address] [place], [place] Clinic Hours: Friday mornings Services, Payment Options, Best way to get seen: Call for info

## 2020-11-13 NOTE — ED Notes (Signed)
See triage note presents with possible dental abscess  Provider in with pt on arrival

## 2022-05-20 ENCOUNTER — Emergency Department: Payer: PRIVATE HEALTH INSURANCE

## 2022-05-20 ENCOUNTER — Other Ambulatory Visit: Payer: Self-pay

## 2022-05-20 ENCOUNTER — Emergency Department
Admission: EM | Admit: 2022-05-20 | Discharge: 2022-05-20 | Disposition: A | Discharge status: Home / Self Care | Payer: PRIVATE HEALTH INSURANCE | Attending: Emergency Medicine | Admitting: Emergency Medicine

## 2022-05-20 DIAGNOSIS — Z711 Person with feared health complaint in whom no diagnosis is made: Secondary | ICD-10-CM

## 2022-05-20 DIAGNOSIS — Z202 Contact with and (suspected) exposure to infections with a predominantly sexual mode of transmission: Secondary | ICD-10-CM | POA: Insufficient documentation

## 2022-05-20 DIAGNOSIS — M7021 Olecranon bursitis, right elbow: Secondary | ICD-10-CM | POA: Insufficient documentation

## 2022-05-20 DIAGNOSIS — Y939 Activity, unspecified: Secondary | ICD-10-CM | POA: Insufficient documentation

## 2022-05-20 LAB — CHLAMYDIA/NGC RT PCR (ARMC ONLY)
Chlamydia Tr: NOT DETECTED
N gonorrhoeae: NOT DETECTED

## 2022-05-20 NOTE — Discharge Instructions (Signed)
.    Follow-up withFollow-up with your regular doctor and orthopedics improving 1 week.  Return if worsening  take ibuprofen for pain and swelling.  If your STD test is positive you should return or go to the health department for treatment

## 2022-05-20 NOTE — ED Provider Notes (Signed)
[facility] Provider Note    Event Date/Time   First MD Initiated Contact with Patient 05/20/22 1324     (approximate)   History   Joint Swelling   HPI  Carl Munoz is a 26 y.o. male with no significant past medical history presents emergency department with right elbow swelling for 3 to 4 weeks after a fall.  Patient is also concerned about STD as he has burning with urination.  States he has had an STD before and this does not feel as bad but almost the same.      Physical Exam   Triage Vital Signs: ED Triage Vitals [05/20/22 1255]  Enc Vitals Group     BP (!) 147/81     Pulse Rate 68     Resp 16     Temp 97.9 F (36.6 C)     Temp Source Oral     SpO2 100 %     Weight 190 lb (86.2 kg)     Height 6' (1.829 m)     Head Circumference      Peak Flow      Pain Score 0     Pain Loc      Pain Edu?      Excl. in GC?     Most recent vital signs: Vitals:   05/20/22 1255  BP: (!) 147/81  Pulse: 68  Resp: 16  Temp: 97.9 F (36.6 C)  SpO2: 100%     General: Awake, no distress.   CV:  Good peripheral perfusion. regular rate and  rhythm Resp:  Normal effort.  Abd:  No distention.   Other:  Right elbow with olecranon bursa swelling, full range of motion of the right elbow, neurovascular intact   ED Results / Procedures / Treatments   Labs (all labs ordered are listed, but only abnormal results are displayed) Labs Reviewed  CHLAMYDIA/NGC RT PCR ([facility] ONLY)               EKG     RADIOLOGY X-ray of the right elbow    PROCEDURES:   Procedures   MEDICATIONS ORDERED IN ED: Medications - No data to display   IMPRESSION / MDM / ASSESSMENT AND PLAN / ED COURSE  I reviewed the triage vital signs and the nursing notes.                              Differential diagnosis includes, but is not limited to, bursitis, fracture, STD  Patient's presentation is most consistent with acute complicated illness / injury requiring  diagnostic workup.   X-ray of the right elbow independently reviewed and interpreted by me as being negative for fracture  GC/chlamydia test ordered  I did explain all findings to the patient.  Explained to him we will call him with his test results.  He can also see them a covid health MyChart.  If anything is positive he will need to be treated.  He is in agreement with this treatment plan.  He is placed in Ace wrap, sling, he is apply ice.  Follow-up with orthopedics not improving 1 week.  He is in agreement and discharged stable condition.      FINAL CLINICAL IMPRESSION(S) / ED DIAGNOSES   Final diagnoses:  Olecranon bursitis of right elbow  Concern about STD in male without diagnosis     Rx / DC Orders   ED Discharge Orders  None        Note:  This document was prepared using Dragon voice recognition software and may include unintentional dictation errors.    Faythe Ghee, PA-C 05/20/22 1403    Jene Every, MD 05/20/22 1447

## 2022-05-20 NOTE — ED Triage Notes (Signed)
Reports fell 5 weeks ago on right elbow during football and since has continues to swell and feel there is fluid in his elbow.

## 2022-08-19 IMAGING — CR DG CHEST 2V
2 series · 2 of 2 positions shown · non-contrast
Comparison: None.

CLINICAL DATA: Chest pain and shortness of breath. History of COVID
pneumonia in [REDACTED].

EXAM:
CHEST - 2 VIEW

[w chest pa]
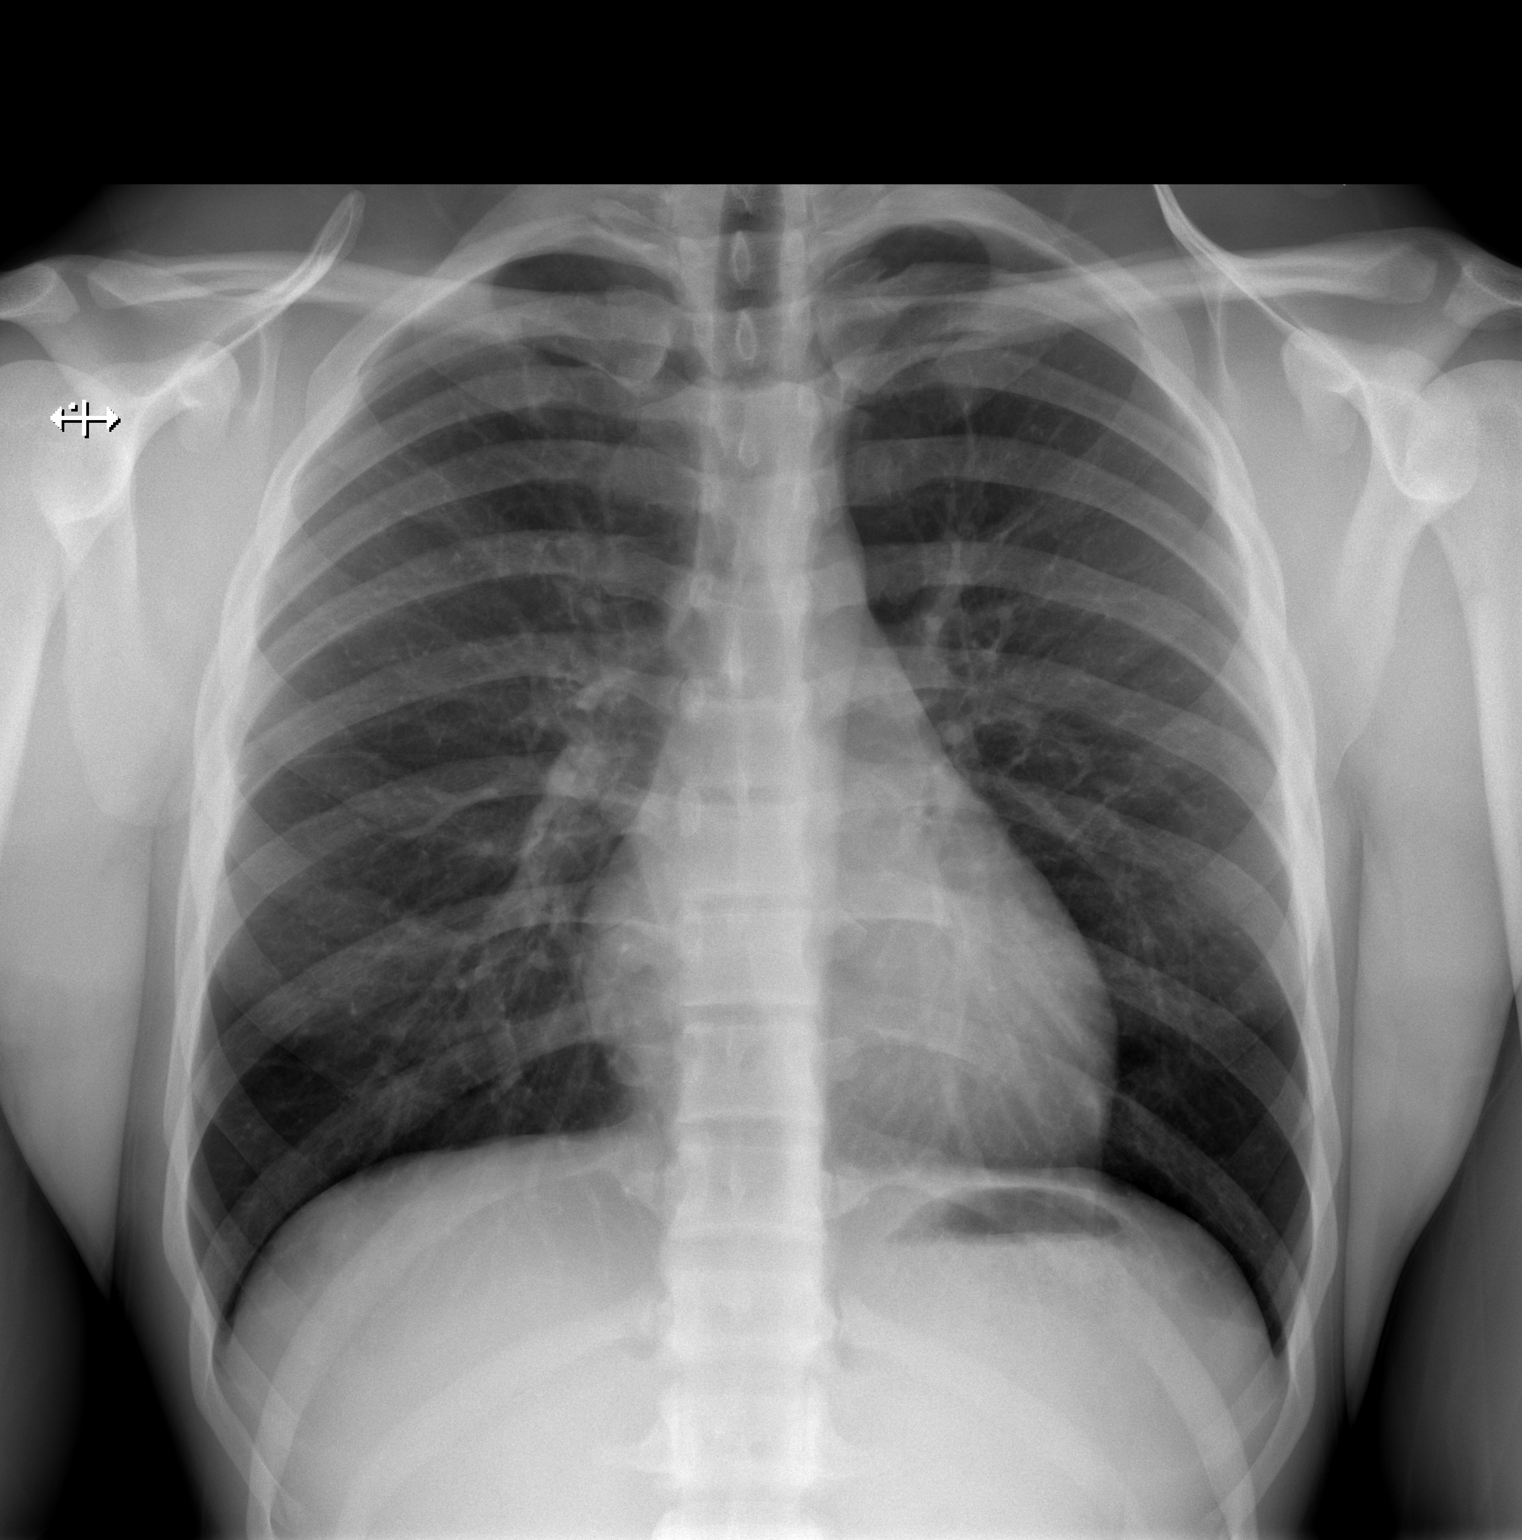

[w chest lat]
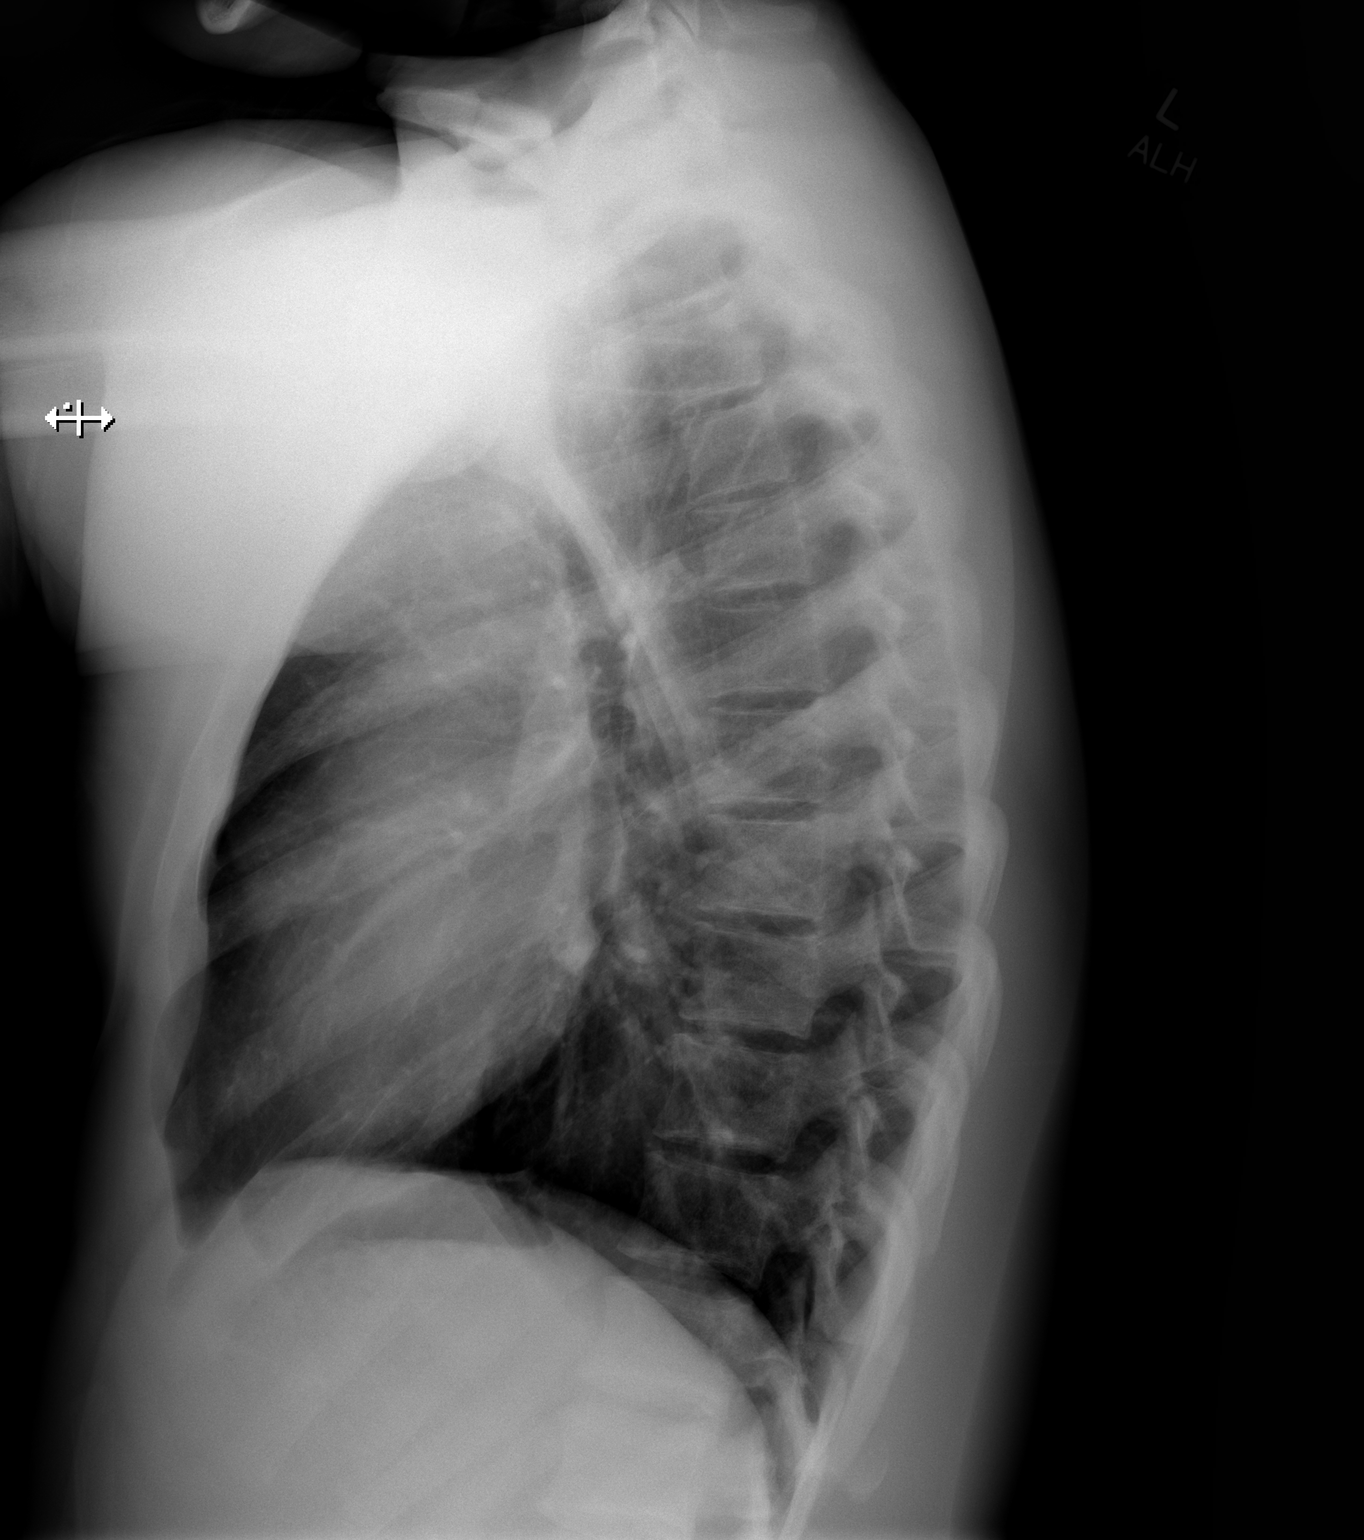

[2 of 2 positions shown; findings below may reference images not displayed]

FINDINGS: The cardiac silhouette, mediastinal and hilar contours are normal.
The lungs are clear. No pleural effusions. No pulmonary nodules. The
bony thorax is intact.
IMPRESSION: Normal chest x-ray.
